# Patient Record
Sex: Female | Born: 1995 | Hispanic: Yes | Marital: Single | Smoking: Never smoker
Health system: Southern US, Community
[De-identification: ages and names within clinical notes are randomized; demographics above are authoritative.]

## PROBLEM LIST (undated history)

## (undated) ENCOUNTER — Inpatient Hospital Stay (HOSPITAL_COMMUNITY): Payer: Self-pay

## (undated) DIAGNOSIS — Z789 Other specified health status: Secondary | ICD-10-CM

## (undated) HISTORY — DX: Other specified health status: Z78.9

## (undated) HISTORY — PX: DILATION AND CURETTAGE OF UTERUS: SHX78

---
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
## 2016-01-20 ENCOUNTER — Encounter: Payer: Self-pay | Admitting: Obstetrics and Gynecology

## 2016-01-20 ENCOUNTER — Ambulatory Visit (INDEPENDENT_AMBULATORY_CARE_PROVIDER_SITE_OTHER): Payer: Medicaid Other | Admitting: Obstetrics and Gynecology

## 2016-01-20 VITALS — BP 113/82 | HR 88 | Ht 68.0 in | Wt 142.3 lb

## 2016-01-20 DIAGNOSIS — Z30430 Encounter for insertion of intrauterine contraceptive device: Secondary | ICD-10-CM

## 2016-01-20 DIAGNOSIS — Z3202 Encounter for pregnancy test, result negative: Secondary | ICD-10-CM

## 2016-01-20 LAB — POCT PREGNANCY, URINE: PREG TEST UR: NEGATIVE

## 2016-01-20 MED ORDER — LEVONORGESTREL 18.6 MCG/DAY IU IUD
INTRAUTERINE_SYSTEM | Freq: Once | INTRAUTERINE | Status: AC
  Administered 2016-01-20: 11:00:00 via INTRAUTERINE

## 2016-01-20 NOTE — Patient Instructions (Signed)
Levonorgestrel intrauterine device (IUD) What is this medicine? LEVONORGESTREL IUD (LEE voe nor jes trel) is a contraceptive (birth control) device. The device is placed inside the uterus by a healthcare professional. It is used to prevent pregnancy and can also be used to treat heavy bleeding that occurs during your period. Depending on the device, it can be used for 3 to 5 years. This medicine may be used for other purposes; ask your health care provider or pharmacist if you have questions. What should I tell my health care provider before I take this medicine? They need to know if you have any of these conditions: -abnormal Pap smear -cancer of the breast, uterus, or cervix -diabetes -endometritis -genital or pelvic infection now or in the past -have more than one sexual partner or your partner has more than one partner -heart disease -history of an ectopic or tubal pregnancy -immune system problems -IUD in place -liver disease or tumor -problems with blood clots or take blood-thinners -use intravenous drugs -uterus of unusual shape -vaginal bleeding that has not been explained -an unusual or allergic reaction to levonorgestrel, other hormones, silicone, or polyethylene, medicines, foods, dyes, or preservatives -pregnant or trying to get pregnant -breast-feeding How should I use this medicine? This device is placed inside the uterus by a health care professional. Talk to your pediatrician regarding the use of this medicine in children. Special care may be needed. Overdosage: If you think you have taken too much of this medicine contact a poison control center or emergency room at once. NOTE: This medicine is only for you. Do not share this medicine with others. What if I miss a dose? This does not apply. What may interact with this medicine? Do not take this medicine with any of the following medications: -amprenavir -bosentan -fosamprenavir This medicine may also interact with  the following medications: -aprepitant -barbiturate medicines for inducing sleep or treating seizures -bexarotene -griseofulvin -medicines to treat seizures like carbamazepine, ethotoin, felbamate, oxcarbazepine, phenytoin, topiramate -modafinil -pioglitazone -rifabutin -rifampin -rifapentine -some medicines to treat HIV infection like atazanavir, indinavir, lopinavir, nelfinavir, tipranavir, ritonavir -St. John's wort -warfarin This list may not describe all possible interactions. Give your health care provider a list of all the medicines, herbs, non-prescription drugs, or dietary supplements you use. Also tell them if you smoke, drink alcohol, or use illegal drugs. Some items may interact with your medicine. What should I watch for while using this medicine? Visit your doctor or health care professional for regular check ups. See your doctor if you or your partner has sexual contact with others, becomes HIV positive, or gets a sexual transmitted disease. This product does not protect you against HIV infection (AIDS) or other sexually transmitted diseases. You can check the placement of the IUD yourself by reaching up to the top of your vagina with clean fingers to feel the threads. Do not pull on the threads. It is a good habit to check placement after each menstrual period. Call your doctor right away if you feel more of the IUD than just the threads or if you cannot feel the threads at all. The IUD may come out by itself. You may become pregnant if the device comes out. If you notice that the IUD has come out use a backup birth control method like condoms and call your health care provider. Using tampons will not change the position of the IUD and are okay to use during your period. What side effects may I notice from receiving this medicine?   Side effects that you should report to your doctor or health care professional as soon as possible: -allergic reactions like skin rash, itching or  hives, swelling of the face, lips, or tongue -fever, flu-like symptoms -genital sores -high blood pressure -no menstrual period for 6 weeks during use -pain, swelling, warmth in the leg -pelvic pain or tenderness -severe or sudden headache -signs of pregnancy -stomach cramping -sudden shortness of breath -trouble with balance, talking, or walking -unusual vaginal bleeding, discharge -yellowing of the eyes or skin Side effects that usually do not require medical attention (report to your doctor or health care professional if they continue or are bothersome): -acne -breast pain -change in sex drive or performance -changes in weight -cramping, dizziness, or faintness while the device is being inserted -headache -irregular menstrual bleeding within first 3 to 6 months of use -nausea This list may not describe all possible side effects. Call your doctor for medical advice about side effects. You may report side effects to FDA at 1-800-FDA-1088. Where should I keep my medicine? This does not apply. NOTE: This sheet is a summary. It may not cover all possible information. If you have questions about this medicine, talk to your doctor, pharmacist, or health care provider.    2016, Elsevier/Gold Standard. (2011-04-23 13:54:04)  

## 2016-01-20 NOTE — Progress Notes (Signed)
Subjective:     Beverly Walter is a 20 y.o. female [phone] who presents for a postpartum visit. She is 6 weeks postpartum following a spontaneous vaginal delivery. I have fully reviewed the prenatal and intrapartum course. The delivery was at 34 gestational weeks. PROM, SOOL. No perineal lacerations. Outcome: vaginal birth after cesarean (VBAC). Anesthesia: epidural. Postpartum course has been uncomplicated. Baby's course has been uncomplicated. Baby is feeding by both breast and bottle - Similac Neosure. Bleeding no bleeding after 2 wks until onset menses 3 days ago.Bowel function is normal. Bladder function is normal. Patient is not sexually active. Contraception method is LARC planned. Considering IUD or Nexplanon.  Postpartum depression screening: negative.  The following portions of the patient's history were reviewed and updated as appropriate: allergies, current medications, past family history, past medical history, past social history, past surgical history and problem list.  Review of Systems Pertinent items are noted in HPI.   Objective:    BP 113/82   Pulse 88   General:  alert, cooperative and no distress   Breasts:  inspection negative, no nipple discharge or bleeding, no masses or nodularity palpable  Lungs: clear to auscultation bilaterally  Heart:  regular rate and rhythm, S1, S2 normal, no murmur, click, rub or gallop  Abdomen: soft, non-tender; bowel sounds normal; no masses,  no organomegaly   Vulva:  not evaluated  Vagina: not evaluated  Cervix:  not evaluated  Corpus: not examined  Adnexa:  not evaluated  Rectal Exam: Not performed.        Assessment:     Teen P1102 at 6 wks postpartum exam. Pap smear not done at today's visit.   Plan:    1. Contraception: counseled at length on options> desires Nexplanon 2. Encouraged continuation of breastfeeding 3. Follow up in: 6 months or as needed.    Procedure Note: Liletta Insertion  Indicaiton: desires LARC. No  medical contraindications to hormonal contraception. Time out performed and informed consent done. Counseled on risks including infection, bleeding, abnormal bleeding patter, migration of device.   With patient in lithotomy, sterile speculum inserted and cx visualized mid position, moderate bleeding. . Cleansed with Betadine. Sounded to 6 cm. Liletta inserted per manufacturer's recommendation. Stings cut to 3 cm. Patient tolerated the procedure well  Return for string check in 4-5 weeks

## 2016-02-25 ENCOUNTER — Ambulatory Visit: Payer: Self-pay | Admitting: Family Medicine

## 2017-04-28 ENCOUNTER — Ambulatory Visit: Payer: Self-pay

## 2018-06-28 ENCOUNTER — Ambulatory Visit: Payer: Self-pay | Admitting: Student

## 2018-06-30 ENCOUNTER — Telehealth: Payer: Self-pay

## 2018-06-30 NOTE — Telephone Encounter (Signed)
Pt called in because she missed her appointment and was wondering if she needed to be seen for toxic shock syndrome, pt stated that the tampon has since been removed and she has not had a period since, advised pt to take a home UPT to be sure and, Advised pt that if she didn't have any fever or chills than she doesn't need to be seen in the office, if she became symptomatic then she should go to the Emergency room to be seen. Pt verbalized understanding

## 2019-12-06 ENCOUNTER — Ambulatory Visit (INDEPENDENT_AMBULATORY_CARE_PROVIDER_SITE_OTHER): Payer: Medicaid Other | Admitting: Family Medicine

## 2019-12-06 ENCOUNTER — Other Ambulatory Visit (HOSPITAL_COMMUNITY)
Admission: RE | Admit: 2019-12-06 | Discharge: 2019-12-06 | Disposition: A | Discharge status: Home / Self Care | Payer: Medicaid Other | Source: Ambulatory Visit | Attending: Family Medicine | Admitting: Family Medicine

## 2019-12-06 ENCOUNTER — Encounter: Payer: Self-pay | Admitting: Family Medicine

## 2019-12-06 ENCOUNTER — Other Ambulatory Visit: Payer: Self-pay

## 2019-12-06 VITALS — BP 126/84 | HR 85 | Wt 118.0 lb

## 2019-12-06 DIAGNOSIS — Z30433 Encounter for removal and reinsertion of intrauterine contraceptive device: Secondary | ICD-10-CM | POA: Insufficient documentation

## 2019-12-06 DIAGNOSIS — Z975 Presence of (intrauterine) contraceptive device: Secondary | ICD-10-CM

## 2019-12-06 MED ORDER — LEVONORGESTREL 19.5 MCG/DAY IU IUD: INTRAUTERINE_SYSTEM | Freq: Once | INTRAUTERINE | Status: AC

## 2019-12-06 NOTE — Progress Notes (Signed)
    GYNECOLOGY OFFICE PROCEDURE NOTE  Beverly Walter is a 24 y.o. [id number] here for [place] IUD removal. No GYN concerns.  Never had pap smear, will complete today.  IUD Removal/reinsertion Patient identified, informed consent performed, consent signed.  Patient was in the dorsal lithotomy position, normal external genitalia was noted.  A speculum was placed in the patient's vagina, normal discharge was noted, no lesions. The cervix was visualized, no lesions, no abnormal discharge.  The strings of the IUD were grasped and pulled using ring forceps. The IUD was removed in its entirety.   Grasped anteriourly/posteriorly with a single tooth tenaculum.  Uterus sounded to 6cm.  Liletta IUD placed per manufacturer's recommendations.  Strings trimmed to 3 cm. Pt tolerated procedure well.  Patient given post procedure instructions and IUD care card with expiration dat  Arrie Senate, MD OB Fellow, [facility] 12/06/2019 1:14 PM

## 2019-12-06 NOTE — Patient Instructions (Signed)
Levonorgestrel intrauterine device (IUD) What is this medicine? LEVONORGESTREL IUD (LEE voe nor jes trel) is a contraceptive (birth control) device. The device is placed inside the uterus by a healthcare professional. It is used to prevent pregnancy. This device can also be used to treat heavy bleeding that occurs during your period. This medicine may be used for other purposes; ask your health care provider or pharmacist if you have questions. COMMON BRAND NAME(S): Kyleena, LILETTA, Mirena, Skyla What should I tell my health care provider before I take this medicine? They need to know if you have any of these conditions:  abnormal Pap smear  cancer of the breast, uterus, or cervix  diabetes  endometritis  genital or pelvic infection now or in the past  have more than one sexual partner or your partner has more than one partner  heart disease  history of an ectopic or tubal pregnancy  immune system problems  IUD in place  liver disease or tumor  problems with blood clots or take blood-thinners  seizures  use intravenous drugs  uterus of unusual shape  vaginal bleeding that has not been explained  an unusual or allergic reaction to levonorgestrel, other hormones, silicone, or polyethylene, medicines, foods, dyes, or preservatives  pregnant or trying to get pregnant  breast-feeding How should I use this medicine? This device is placed inside the uterus by a health care professional. Talk to your pediatrician regarding the use of this medicine in children. Special care may be needed. Overdosage: If you think you have taken too much of this medicine contact a poison control center or emergency room at once. NOTE: This medicine is only for you. Do not share this medicine with others. What if I miss a dose? This does not apply. Depending on the brand of device you have inserted, the device will need to be replaced every 3 to 6 years if you wish to continue using this type  of birth control. What may interact with this medicine? Do not take this medicine with any of the following medications:  amprenavir  bosentan  fosamprenavir This medicine may also interact with the following medications:  aprepitant  armodafinil  barbiturate medicines for inducing sleep or treating seizures  bexarotene  boceprevir  griseofulvin  medicines to treat seizures like carbamazepine, ethotoin, felbamate, oxcarbazepine, phenytoin, topiramate  modafinil  pioglitazone  rifabutin  rifampin  rifapentine  some medicines to treat HIV infection like atazanavir, efavirenz, indinavir, lopinavir, nelfinavir, tipranavir, ritonavir  St. John's wort  warfarin This list may not describe all possible interactions. Give your health care provider a list of all the medicines, herbs, non-prescription drugs, or dietary supplements you use. Also tell them if you smoke, drink alcohol, or use illegal drugs. Some items may interact with your medicine. What should I watch for while using this medicine? Visit your doctor or health care professional for regular check ups. See your doctor if you or your partner has sexual contact with others, becomes HIV positive, or gets a sexual transmitted disease. This product does not protect you against HIV infection (AIDS) or other sexually transmitted diseases. You can check the placement of the IUD yourself by reaching up to the top of your vagina with clean fingers to feel the threads. Do not pull on the threads. It is a good habit to check placement after each menstrual period. Call your doctor right away if you feel more of the IUD than just the threads or if you cannot feel the threads at   all. The IUD may come out by itself. You may become pregnant if the device comes out. If you notice that the IUD has come out use a backup birth control method like condoms and call your health care provider. Using tampons will not change the position of the  IUD and are okay to use during your period. This IUD can be safely scanned with magnetic resonance imaging (MRI) only under specific conditions. Before you have an MRI, tell your healthcare provider that you have an IUD in place, and which type of IUD you have in place. What side effects may I notice from receiving this medicine? Side effects that you should report to your doctor or health care professional as soon as possible:  allergic reactions like skin rash, itching or hives, swelling of the face, lips, or tongue  fever, flu-like symptoms  genital sores  high blood pressure  no menstrual period for 6 weeks during use  pain, swelling, warmth in the leg  pelvic pain or tenderness  severe or sudden headache  signs of pregnancy  stomach cramping  sudden shortness of breath  trouble with balance, talking, or walking  unusual vaginal bleeding, discharge  yellowing of the eyes or skin Side effects that usually do not require medical attention (report to your doctor or health care professional if they continue or are bothersome):  acne  breast pain  change in sex drive or performance  changes in weight  cramping, dizziness, or faintness while the device is being inserted  headache  irregular menstrual bleeding within first 3 to 6 months of use  nausea This list may not describe all possible side effects. Call your doctor for medical advice about side effects. You may report side effects to FDA at 1-800-FDA-1088. Where should I keep my medicine? This does not apply. NOTE: This sheet is a summary. It may not cover all possible information. If you have questions about this medicine, talk to your doctor, pharmacist, or health care provider.  2020 Elsevier/Gold Standard (2018-02-01 13:22:01)  

## 2019-12-07 LAB — CYTOLOGY - PAP
Chlamydia: NEGATIVE
Comment: NEGATIVE
Comment: NEGATIVE
Comment: NORMAL
Diagnosis: NEGATIVE
High risk HPV: NEGATIVE
Neisseria Gonorrhea: NEGATIVE

## 2019-12-10 ENCOUNTER — Emergency Department (HOSPITAL_COMMUNITY): Payer: Self-pay

## 2019-12-10 ENCOUNTER — Other Ambulatory Visit: Payer: Self-pay

## 2019-12-10 ENCOUNTER — Emergency Department (HOSPITAL_COMMUNITY)
Admission: EM | Admit: 2019-12-10 | Discharge: 2019-12-10 | Disposition: A | Discharge status: Home / Self Care | Payer: Self-pay | Attending: Emergency Medicine | Admitting: Emergency Medicine

## 2019-12-10 ENCOUNTER — Encounter (HOSPITAL_COMMUNITY): Payer: Self-pay

## 2019-12-10 DIAGNOSIS — Z79899 Other long term (current) drug therapy: Secondary | ICD-10-CM | POA: Insufficient documentation

## 2019-12-10 DIAGNOSIS — J029 Acute pharyngitis, unspecified: Secondary | ICD-10-CM | POA: Insufficient documentation

## 2019-12-10 DIAGNOSIS — E041 Nontoxic single thyroid nodule: Secondary | ICD-10-CM | POA: Insufficient documentation

## 2019-12-10 LAB — CBC WITH DIFFERENTIAL/PLATELET
Abs Immature Granulocytes: 0.01 10*3/uL (ref 0.00–0.07)
Basophils Absolute: 0 10*3/uL (ref 0.0–0.1)
Basophils Relative: 0 %
Eosinophils Absolute: 0 10*3/uL (ref 0.0–0.5)
Eosinophils Relative: 1 %
HCT: 44.9 % (ref 36.0–46.0)
Hemoglobin: 14.8 g/dL (ref 12.0–15.0)
Immature Granulocytes: 0 %
Lymphocytes Relative: 26 %
Lymphs Abs: 1.7 10*3/uL (ref 0.7–4.0)
MCH: 29.8 pg (ref 26.0–34.0)
MCHC: 33 g/dL (ref 30.0–36.0)
MCV: 90.5 fL (ref 80.0–100.0)
Monocytes Absolute: 0.6 10*3/uL (ref 0.1–1.0)
Monocytes Relative: 10 %
Neutro Abs: 4 10*3/uL (ref 1.7–7.7)
Neutrophils Relative %: 63 %
Platelets: 238 10*3/uL (ref 150–400)
RBC: 4.96 MIL/uL (ref 3.87–5.11)
RDW: 12 % (ref 11.5–15.5)
WBC: 6.4 10*3/uL (ref 4.0–10.5)
nRBC: 0 % (ref 0.0–0.2)

## 2019-12-10 LAB — BASIC METABOLIC PANEL
Anion gap: 8 (ref 5–15)
BUN: 11 mg/dL (ref 6–20)
CO2: 26 mmol/L (ref 22–32)
Calcium: 9.6 mg/dL (ref 8.9–10.3)
Chloride: 103 mmol/L (ref 98–111)
Creatinine, Ser: 0.61 mg/dL (ref 0.44–1.00)
GFR calc Af Amer: >60 mL/min (ref >60)
GFR calc non Af Amer: >60 mL/min (ref >60)
Glucose, Bld: 99 mg/dL (ref 70–99)
Potassium: 3.2 mmol/L — ABNORMAL LOW (ref 3.5–5.1)
Sodium: 137 mmol/L (ref 135–145)

## 2019-12-10 LAB — GROUP A STREP BY PCR: Group A Strep by PCR: NOT DETECTED

## 2019-12-10 LAB — MONONUCLEOSIS SCREEN: Mono Screen: NEGATIVE

## 2019-12-10 MED ORDER — POTASSIUM CHLORIDE CRYS ER 20 MEQ PO TBCR
40.0000 meq | EXTENDED_RELEASE_TABLET | Freq: Once | ORAL | Status: AC
  Administered 2019-12-10: 40 meq via ORAL
  Filled 2019-12-10: qty 2

## 2019-12-10 MED ORDER — IOHEXOL 300 MG/ML  SOLN
75.0000 mL | Freq: Once | INTRAMUSCULAR | Status: AC | PRN
  Administered 2019-12-10: 75 mL via INTRAVENOUS

## 2019-12-10 NOTE — Discharge Instructions (Addendum)
Your work-up today was reassuring, your CT scan does show a thyroid nodule, you can use the attached instructions to read about this.  I need you to follow-up with your primary care doctor to get an ultrasound of this as we spoke about.  If you do not have one you can use the list below.  Or you can go to [facility] which I referred you to.  If you have any shortness of breath, difficulty breathing, difficulty swallowing, drooling please come back to the emergency department.  Your potassium was slightly low, we did give you some potassium here today.  I want you to eat bananas when you get home for the next couple of days.  Please come back to the emergency department for any new or worsening concerning symptoms.

## 2019-12-10 NOTE — ED Provider Notes (Signed)
[facility] EMERGENCY DEPARTMENT Provider Note   CSN: [id number] Arrival date & time: 12/10/19  1058     History No chief complaint on file.   Beverly Walter is a 24 y.o. female with IDA that presents the emergency department today for sore throat and neck mass.  Patient states that she has had this for 5 weeks, states that a couple weeks ago she started having difficulty breathing with mass increasing in size.  States that it was worse when she lay down.  States that she had a fever with bad sore throat when this occurred.  Was tested for Covid at the time which was negative.  States that symptoms started to disappear but however the past couple of days mass has gotten bigger.   No current difficulty breathing, however feels like that it could go this way again.  Was supposed to get a scan of her throat with her primary care, however her insurance would not cover it therefore she came to the emergency room.  Currently states that her sore throat is mild, describes it as scratchy. No truly sore.  States that lump in her throat is starting to expand again.  Denies any current fevers, chills, cough, congestion, other lumps, night sweats, nausea, vomiting, abdominal pain, back pain.  States that she has been eating and drinking normally.  Has been able to handle secretions.  No dysphagia or odynophagia.  Denies any history of thyroid cancer or lymphoma, no history of thyroid disease.  States that she lost about 3 pounds in the past couple of weeks unintentionally.  Nothing makes it worse or better.  Has not tried anything for it.  HPI     Past Medical History:  Diagnosis Date  . Medical history non-contributory     Patient Active Problem List   Diagnosis Date Noted  . IUD (intrauterine device) in place 12/06/2019  . Iron deficiency anemia 11/08/2015    Past Surgical History:  Procedure Laterality Date  . CESAREAN SECTION    . DILATION AND CURETTAGE OF UTERUS       OB  History    Gravida  3   Para  2   Term  1   Preterm  1   AB  1   Living  2     SAB  1   TAB  0   Ectopic  0   Multiple  0   Live Births  1           No family history on file.  Social History   Tobacco Use  . Smoking status: Never Smoker  . Smokeless tobacco: Never Used  Substance Use Topics  . Alcohol use: No  . Drug use: No    Home Medications Prior to Admission medications   Medication Sig Start Date End Date Taking? Authorizing Provider  citalopram (CELEXA) 20 MG tablet Take 1 tablet (20 mg total) by mouth daily. Patient not taking: Reported on 01/20/2016 11/21/15   Truett Mainland, DO  ferrous sulfate 325 (65 FE) MG tablet Take 1 tablet (325 mg total) by mouth daily. Patient not taking: Reported on 01/20/2016 11/08/15   Luvenia Redden, PA-C  ibuprofen (ADVIL,MOTRIN) 600 MG tablet Take 1 tablet (600 mg total) by mouth every 6 (six) hours. 12/09/15   Keitha Butte, CNM  Prenatal Multivit-Min-Fe-FA (PRENATAL VITAMINS) 0.8 MG tablet Take 1 tablet by mouth daily. Patient not taking: Reported on 01/20/2016 10/14/15   Woodroe Mode, MD  Allergies    Patient has no known allergies.  Review of Systems   Review of Systems  Constitutional: Negative for chills, diaphoresis, fatigue and fever.  HENT: Positive for sore throat. Negative for congestion, drooling, facial swelling, nosebleeds, postnasal drip, rhinorrhea, sinus pressure, sinus pain, sneezing and trouble swallowing.   Eyes: Negative for pain and visual disturbance.  Respiratory: Negative for cough, shortness of breath and wheezing.   Cardiovascular: Negative for chest pain, palpitations and leg swelling.  Gastrointestinal: Negative for abdominal distention, abdominal pain, diarrhea, nausea and vomiting.  Genitourinary: Negative for difficulty urinating.  Musculoskeletal: Negative for back pain, neck pain and neck stiffness.  Skin: Negative for pallor.  Neurological: Negative for dizziness, speech  difficulty, weakness and headaches.  Psychiatric/Behavioral: Negative for confusion.    Physical Exam Updated Vital Signs BP 117/83 (BP Location: Left Arm)   Pulse 82   Temp 97.6 F (36.4 C) (Oral)   Resp 14   SpO2 99%   Physical Exam Constitutional:      General: She is not in acute distress.    Appearance: Normal appearance. She is not ill-appearing, toxic-appearing or diaphoretic.     Comments: Patient without acute respiratory stress.  Patient is sitting comfortably in bed, no tripoding, use of accessory muscles.  Patient is speaking to me in full sentences.  Handling secretions well.  HENT:     Head: Normocephalic and atraumatic.     Jaw: There is normal jaw occlusion. No trismus, swelling or malocclusion.     Nose: No congestion or rhinorrhea.     Right Sinus: No maxillary sinus tenderness or frontal sinus tenderness.     Left Sinus: No maxillary sinus tenderness or frontal sinus tenderness.     Mouth/Throat:     Mouth: Mucous membranes are moist. No oral lesions.     Dentition: Normal dentition.     Tongue: No lesions.     Palate: No mass and lesions.     Pharynx: Oropharynx is clear. Uvula midline. No pharyngeal swelling, oropharyngeal exudate, posterior oropharyngeal erythema or uvula swelling.     Tonsils: No tonsillar exudate or tonsillar abscesses. 1+ on the right. 1+ on the left.     Comments: Patient without tonsillar enlargement or exudate.  No signs of peritonsillar abscess, palate without any tenderness or masses palpated.  No swelling under the tongue, uvula is midline without any inflammation. Eyes:     General: No visual field deficit or scleral icterus.       Right eye: No discharge.        Left eye: No discharge.     Extraocular Movements: Extraocular movements intact.     Conjunctiva/sclera: Conjunctivae normal.     Pupils: Pupils are equal, round, and reactive to light.  Neck:     Thyroid: No thyromegaly or thyroid tenderness.      Comments: Neck mass  as depicted above, is a little bigger than a marble, mobile and nontender.  On trachea, slightly below the thyroid glands.  No erythema or induration around it.  No warmth.  Patient is able to swallow without any difficulties. Cardiovascular:     Rate and Rhythm: Normal rate and regular rhythm.     Pulses: Normal pulses.     Heart sounds: Normal heart sounds. No murmur heard.  No friction rub. No gallop.   Pulmonary:     Effort: Pulmonary effort is normal. No respiratory distress.     Breath sounds: Normal breath sounds. No stridor. No wheezing, rhonchi  or rales.  Chest:     Chest wall: No tenderness.  Abdominal:     General: Abdomen is flat. Bowel sounds are normal. There is no distension.     Palpations: Abdomen is soft.     Tenderness: There is no abdominal tenderness. There is no right CVA tenderness, left CVA tenderness, guarding or rebound.  Musculoskeletal:        General: No swelling or tenderness. Normal range of motion.     Cervical back: Full passive range of motion without pain, normal range of motion and neck supple. No rigidity or tenderness. No pain with movement. Normal range of motion.     Right lower leg: No edema.     Left lower leg: No edema.  Lymphadenopathy:     Head:     Right side of head: No submental, submandibular, tonsillar, preauricular, posterior auricular or occipital adenopathy.     Left side of head: No submental, submandibular, tonsillar, preauricular, posterior auricular or occipital adenopathy.     Cervical: No cervical adenopathy.     Right cervical: No superficial, deep or posterior cervical adenopathy.    Left cervical: No superficial, deep or posterior cervical adenopathy.     Upper Body:     Right upper body: No supraclavicular or axillary adenopathy.     Left upper body: No supraclavicular or axillary adenopathy.  Skin:    General: Skin is warm and dry.     Capillary Refill: Capillary refill takes less than 2 seconds.     Coloration: Skin is  not pale.     Findings: No erythema or rash.  Neurological:     General: No focal deficit present.     Mental Status: She is alert and oriented to person, place, and time.     Cranial Nerves: Cranial nerves are intact. No cranial nerve deficit or facial asymmetry.     Motor: Motor function is intact. No weakness.     Coordination: Coordination is intact.     Gait: Gait is intact. Gait normal.  Psychiatric:        Mood and Affect: Mood normal.        Behavior: Behavior normal.     ED Results / Procedures / Treatments   Labs (all labs ordered are listed, but only abnormal results are displayed) Labs Reviewed  BASIC METABOLIC PANEL - Abnormal; Notable for the following components:      Result Value   Potassium 3.2 (*)    All other components within normal limits  GROUP A STREP BY PCR  CBC WITH DIFFERENTIAL/PLATELET  MONONUCLEOSIS SCREEN    EKG None  Radiology CT Soft Tissue Neck W Contrast  Result Date: 12/10/2019 CLINICAL DATA:  Neck mass.  Throat tightness. EXAM: CT NECK WITH CONTRAST TECHNIQUE: Multidetector CT imaging of the neck was performed using the standard protocol following the bolus administration of intravenous contrast. CONTRAST:  30mL OMNIPAQUE IOHEXOL 300 MG/ML  SOLN COMPARISON:  None. FINDINGS: Pharynx and larynx: No evidence of mass or swelling. No fluid collection or inflammatory changes in the parapharyngeal or retropharyngeal spaces. Salivary glands: No inflammation, mass, or stone. Thyroid: 1.7 cm well-circumscribed hypoattenuating nodule in the thyroid isthmus. Lymph nodes: No enlarged or suspicious lymph nodes in the neck. Vascular: Major vascular structures of the neck are patent. Limited intracranial: Unremarkable. Visualized orbits: Unremarkable. Mastoids and visualized paranasal sinuses: Opacification of a single left ethmoid air cell. Mild left maxillary sinus mucosal thickening. Clear mastoid air cells. Skeleton: No acute osseous abnormality  or suspicious  osseous lesion. Upper chest: Clear lung apices. Other: None. IMPRESSION: 1. 1.7 cm thyroid nodule. Recommend nonemergent thyroid US (ref: J Am Coll Radiol. 2015 Feb;12(2): 143-50). 2. Otherwise negative neck CT. Electronically Signed   By: Logan Bores M.D.   On: 12/10/2019 19:04    Procedures Procedures (including critical care time)  Medications Ordered in ED Medications  iohexol (OMNIPAQUE) 300 MG/ML solution 75 mL (75 mLs Intravenous Contrast Given 12/10/19 1833)  potassium chloride SA (KLOR-CON) CR tablet 40 mEq (40 mEq Oral Given 12/10/19 1916)    ED Course  I have reviewed the triage vital signs and the nursing notes.  Pertinent labs & imaging results that were available during my care of the patient were reviewed by me and considered in my medical decision making (see chart for details).    MDM Rules/Calculators/A&P                          Beverly Walter is a 24 y.o. female with IDA that presents the emergency department today for sore throat and neck mass. No true sore throat currently. Patient states that she has had this for 5 weeks, states that a couple weeks ago she started having difficulty breathing with mass increasing in size.  Physical exam with marble sized mass on trachea, slightly lower than thyroid gland, nontender and mobile.  Does appear lymph node-like.  No other lymph nodes noted.  No overlying signs of infection.  I think that CT is warranted at this time due to patient stating that it is expanding an she did have difficulty breathing when this happened last time.  Patient currently is not in any acute distress, is able to handle secretions and sitting comfortably in bed.  No signs of peritonsillar abscess, deep space flexion, Ludwig's.  Work-up negative.  BMP with potassium of 3.2, did replete orally today and Dr. Patient about eating high potassium rich foods.  CBC unremarkable.  Mono and strep negative.  CT does show thyroid nodule, recommend nonemergent ultrasound.   Did discuss this with patient.  Patient still not complaining of respiratory distress, sitting comfortably in bed.  Patient agreeable for follow-up with PCP.  Patient be discharged.  Doubt need for further emergent work up at this time. I explained the diagnosis and have given explicit precautions to return to the ER including for any other new or worsening symptoms. The patient understands and accepts the medical plan as it's been dictated and I have answered their questions. Discharge instructions concerning home care and prescriptions have been given. The patient is STABLE and is discharged to home in good condition.  I discussed this case with my attending physician who cosigned this note including patient's presenting symptoms, physical exam, and planned diagnostics and interventions. Attending physician stated agreement with plan or made changes to plan which were implemented.   Final Clinical Impression(s) / ED Diagnoses Final diagnoses:  Thyroid nodule    Rx / DC Orders ED Discharge Orders    None       Alfredia Client, PA-C 12/10/19 1925    Lennice Sites, DO 12/10/19 1942

## 2019-12-10 NOTE — ED Triage Notes (Signed)
Patient complains of pain to possible goiter x several weeks. No distess, no trouble swallowing/

## 2019-12-14 ENCOUNTER — Encounter: Payer: Self-pay | Admitting: General Practice

## 2020-01-17 ENCOUNTER — Ambulatory Visit: Payer: Medicaid Other | Admitting: Family Medicine

## 2020-02-12 ENCOUNTER — Emergency Department (HOSPITAL_COMMUNITY)
Admission: EM | Admit: 2020-02-12 | Discharge: 2020-02-12 | Disposition: A | Discharge status: Home / Self Care | Payer: Medicaid Other | Attending: Emergency Medicine | Admitting: Emergency Medicine

## 2020-02-12 ENCOUNTER — Emergency Department (HOSPITAL_COMMUNITY): Payer: Medicaid Other

## 2020-02-12 ENCOUNTER — Encounter (HOSPITAL_COMMUNITY): Payer: Self-pay | Admitting: Emergency Medicine

## 2020-02-12 ENCOUNTER — Other Ambulatory Visit: Payer: Self-pay

## 2020-02-12 DIAGNOSIS — Z5321 Procedure and treatment not carried out due to patient leaving prior to being seen by health care provider: Secondary | ICD-10-CM | POA: Insufficient documentation

## 2020-02-12 DIAGNOSIS — R079 Chest pain, unspecified: Secondary | ICD-10-CM | POA: Insufficient documentation

## 2020-02-12 DIAGNOSIS — R0602 Shortness of breath: Secondary | ICD-10-CM | POA: Insufficient documentation

## 2020-02-12 LAB — CBC
HCT: 43.7 % (ref 36.0–46.0)
Hemoglobin: 14.5 g/dL (ref 12.0–15.0)
MCH: 30.8 pg (ref 26.0–34.0)
MCHC: 33.2 g/dL (ref 30.0–36.0)
MCV: 92.8 fL (ref 80.0–100.0)
Platelets: 245 10*3/uL (ref 150–400)
RBC: 4.71 MIL/uL (ref 3.87–5.11)
RDW: 12 % (ref 11.5–15.5)
WBC: 5.5 10*3/uL (ref 4.0–10.5)
nRBC: 0 % (ref 0.0–0.2)

## 2020-02-12 LAB — BASIC METABOLIC PANEL
Anion gap: 10 (ref 5–15)
BUN: 7 mg/dL (ref 6–20)
CO2: 22 mmol/L (ref 22–32)
Calcium: 9.4 mg/dL (ref 8.9–10.3)
Chloride: 107 mmol/L (ref 98–111)
Creatinine, Ser: 0.57 mg/dL (ref 0.44–1.00)
GFR, Estimated: >60 mL/min (ref >60)
Glucose, Bld: 100 mg/dL — ABNORMAL HIGH (ref 70–99)
Potassium: 3.7 mmol/L (ref 3.5–5.1)
Sodium: 139 mmol/L (ref 135–145)

## 2020-02-12 LAB — I-STAT BETA HCG BLOOD, ED (MC, WL, AP ONLY): I-stat hCG, quantitative: <5 m[IU]/mL (ref <5)

## 2020-02-12 LAB — TROPONIN I (HIGH SENSITIVITY)
Troponin I (High Sensitivity): <2 ng/L (ref <18)
Troponin I (High Sensitivity): <2 ng/L (ref <18)

## 2020-02-12 MED ORDER — ALBUTEROL SULFATE (2.5 MG/3ML) 0.083% IN NEBU: 5.0000 mg | INHALATION_SOLUTION | Freq: Once | RESPIRATORY_TRACT | Status: DC

## 2020-02-12 NOTE — ED Notes (Signed)
Pt left due to not being seen quick enough 

## 2020-02-12 NOTE — ED Triage Notes (Signed)
Mid cp since last Thursday with no relief and SOB.

## 2020-02-12 NOTE — ED Triage Notes (Addendum)
Chart on wrong pt

## 2020-02-13 ENCOUNTER — Other Ambulatory Visit: Payer: Self-pay

## 2020-02-13 ENCOUNTER — Emergency Department (HOSPITAL_COMMUNITY)
Admission: EM | Admit: 2020-02-13 | Discharge: 2020-02-14 | Disposition: A | Discharge status: Home / Self Care | Payer: Medicaid Other | Attending: Emergency Medicine | Admitting: Emergency Medicine

## 2020-02-13 ENCOUNTER — Telehealth (HOSPITAL_COMMUNITY): Payer: Self-pay

## 2020-02-13 ENCOUNTER — Encounter (HOSPITAL_COMMUNITY): Payer: Self-pay

## 2020-02-13 DIAGNOSIS — R002 Palpitations: Secondary | ICD-10-CM | POA: Insufficient documentation

## 2020-02-13 DIAGNOSIS — E041 Nontoxic single thyroid nodule: Secondary | ICD-10-CM | POA: Insufficient documentation

## 2020-02-13 DIAGNOSIS — R079 Chest pain, unspecified: Secondary | ICD-10-CM | POA: Insufficient documentation

## 2020-02-13 NOTE — ED Triage Notes (Signed)
Pt was seen 12-10-19 in ED for neck swelling, dx thyroid nodule.  Was advised to get non-emergent thyroid u/s.  Was referred to her OBGYN who could not do u/s.   Came to ED yesterday but LWBS  Pt feels thyroid nodule has gotten slightly larger.

## 2020-02-14 ENCOUNTER — Encounter: Payer: Self-pay | Admitting: *Deleted

## 2020-02-14 ENCOUNTER — Emergency Department (HOSPITAL_COMMUNITY): Payer: Medicaid Other

## 2020-02-14 LAB — BASIC METABOLIC PANEL WITH GFR
Anion gap: 11 (ref 5–15)
BUN: 10 mg/dL (ref 6–20)
CO2: 23 mmol/L (ref 22–32)
Calcium: 10 mg/dL (ref 8.9–10.3)
Chloride: 105 mmol/L (ref 98–111)
Creatinine, Ser: 0.54 mg/dL (ref 0.44–1.00)
GFR, Estimated: >60 mL/min
Glucose, Bld: 81 mg/dL (ref 70–99)
Potassium: 3.8 mmol/L (ref 3.5–5.1)
Sodium: 139 mmol/L (ref 135–145)

## 2020-02-14 LAB — TROPONIN I (HIGH SENSITIVITY): Troponin I (High Sensitivity): 3 ng/L (ref <18)

## 2020-02-14 LAB — CBC WITH DIFFERENTIAL/PLATELET
Abs Immature Granulocytes: 0.01 10*3/uL (ref 0.00–0.07)
Basophils Absolute: 0 10*3/uL (ref 0.0–0.1)
Basophils Relative: 0 %
Eosinophils Absolute: 0.1 10*3/uL (ref 0.0–0.5)
Eosinophils Relative: 1 %
HCT: 43.5 % (ref 36.0–46.0)
Hemoglobin: 14.5 g/dL (ref 12.0–15.0)
Immature Granulocytes: 0 %
Lymphocytes Relative: 31 %
Lymphs Abs: 1.8 10*3/uL (ref 0.7–4.0)
MCH: 30.9 pg (ref 26.0–34.0)
MCHC: 33.3 g/dL (ref 30.0–36.0)
MCV: 92.6 fL (ref 80.0–100.0)
Monocytes Absolute: 0.5 10*3/uL (ref 0.1–1.0)
Monocytes Relative: 9 %
Neutro Abs: 3.3 10*3/uL (ref 1.7–7.7)
Neutrophils Relative %: 59 %
Platelets: 254 10*3/uL (ref 150–400)
RBC: 4.7 MIL/uL (ref 3.87–5.11)
RDW: 11.9 % (ref 11.5–15.5)
WBC: 5.7 10*3/uL (ref 4.0–10.5)
nRBC: 0 % (ref 0.0–0.2)

## 2020-02-14 LAB — I-STAT BETA HCG BLOOD, ED (MC, WL, AP ONLY): I-stat hCG, quantitative: <5 m[IU]/mL

## 2020-02-14 LAB — TSH: TSH: 1.897 u[IU]/mL (ref 0.350–4.500)

## 2020-02-14 LAB — D-DIMER, QUANTITATIVE: D-Dimer, Quant: <0.27 ug/mL-FEU (ref 0.00–0.50)

## 2020-02-14 LAB — T4, FREE: Free T4: 0.99 ng/dL (ref 0.61–1.12)

## 2020-02-14 MED ORDER — CITALOPRAM HYDROBROMIDE 20 MG PO TABS: 20.0000 mg | ORAL_TABLET | Freq: Every day | ORAL | 2 refills | Status: AC

## 2020-02-14 NOTE — ED Provider Notes (Signed)
[facility] EMERGENCY DEPARTMENT Provider Note   CSN: [id number] Arrival date & time: 02/13/20  2024     History Chief Complaint  Patient presents with  . Thyroid Nodule    Beverly Walter is a 24 y.o. female.  HPI     This is a 24 year old female who presents with chest pain, palpitations, continued weight loss.  Patient reports that she started having symptoms of weight loss and decreased appetite in July.  She was seen and evaluated in the emergency department September and found to have a thyroid nodule.  She has attempted as an outpatient to obtain a thyroid ultrasound but has been unsuccessful.  She does not have a primary physician.  However, she states since Thursday she has had recurrent sharp chest pains and palpitations.  She states that sometimes the pain is worse with breathing.  It comes and goes.  She reports associated palpitations.  She also reports continued weight loss during this time.  No recent travel or leg swelling.  She currently rates her pain at 5 out of 10.  She is not taking anything for the pain.  She states she is concerned because her grandmother had a heart attack "at my age."  Denies any recent illnesses or fevers.  Past Medical History:  Diagnosis Date  . Medical history non-contributory     Patient Active Problem List   Diagnosis Date Noted  . IUD (intrauterine device) in place 12/06/2019  . Iron deficiency anemia 11/08/2015    Past Surgical History:  Procedure Laterality Date  . CESAREAN SECTION    . DILATION AND CURETTAGE OF UTERUS       OB History    Gravida  3   Para  2   Term  1   Preterm  1   AB  1   Living  2     SAB  1   TAB  0   Ectopic  0   Multiple  0   Live Births  1           History reviewed. No pertinent family history.  Social History   Tobacco Use  . Smoking status: Never Smoker  . Smokeless tobacco: Never Used  Substance Use Topics  . Alcohol use: No  . Drug use: No     Home Medications Prior to Admission medications   Medication Sig Start Date End Date Taking? Authorizing Provider  citalopram (CELEXA) 20 MG tablet Take 1 tablet (20 mg total) by mouth daily. 02/14/20   Arnel Wymer, Barbette Hair, MD  ferrous sulfate 325 (65 FE) MG tablet Take 1 tablet (325 mg total) by mouth daily. Patient not taking: Reported on 01/20/2016 11/08/15   Luvenia Redden, PA-C  ibuprofen (ADVIL,MOTRIN) 600 MG tablet Take 1 tablet (600 mg total) by mouth every 6 (six) hours. 12/09/15   Keitha Butte, CNM  Prenatal Multivit-Min-Fe-FA (PRENATAL VITAMINS) 0.8 MG tablet Take 1 tablet by mouth daily. Patient not taking: Reported on 01/20/2016 10/14/15   Woodroe Mode, MD    Allergies    Patient has no known allergies.  Review of Systems   Review of Systems  Constitutional: Positive for unexpected weight change. Negative for fever.  Respiratory: Negative for cough and shortness of breath.   Cardiovascular: Positive for chest pain and palpitations. Negative for leg swelling.  Gastrointestinal: Negative for abdominal pain, diarrhea, nausea and vomiting.  Genitourinary: Negative for dysuria.  All other systems reviewed and are negative.   Physical  Exam Updated Vital Signs BP 111/77 (BP Location: Right Arm)   Pulse 88   Temp 98.3 F (36.8 C) (Oral)   Resp 16   SpO2 96%   Physical Exam Vitals and nursing note reviewed.  Constitutional:      Appearance: She is well-developed. She is not ill-appearing.  HENT:     Head: Normocephalic and atraumatic.     Nose: Nose normal.     Mouth/Throat:     Mouth: Mucous membranes are moist.  Eyes:     Pupils: Pupils are equal, round, and reactive to light.  Neck:     Comments: Thyroid nodule palpated Cardiovascular:     Rate and Rhythm: Normal rate and regular rhythm.     Heart sounds: Normal heart sounds.  Pulmonary:     Effort: Pulmonary effort is normal. No respiratory distress.     Breath sounds: No wheezing.  Abdominal:      General: Bowel sounds are normal.     Palpations: Abdomen is soft.     Tenderness: There is no abdominal tenderness.  Musculoskeletal:        General: No tenderness.     Cervical back: Neck supple.     Right lower leg: No edema.     Left lower leg: No edema.  Skin:    General: Skin is warm and dry.  Neurological:     Mental Status: She is alert and oriented to person, place, and time.  Psychiatric:        Mood and Affect: Mood normal.     ED Results / Procedures / Treatments   Labs (all labs ordered are listed, but only abnormal results are displayed) Labs Reviewed  CBC WITH DIFFERENTIAL/PLATELET  BASIC METABOLIC PANEL  D-DIMER, QUANTITATIVE (NOT AT [facility])  TSH  T4, FREE  I-STAT BETA HCG BLOOD, ED ([facility], [facility], [facility] ONLY)  TROPONIN I (HIGH SENSITIVITY)    EKG EKG Interpretation  Date/Time:  Tuesday February 13 2020 20:41:22 EST Ventricular Rate:  74 PR Interval:  154 QRS Duration: 78 QT Interval:  384 QTC Calculation: 426 R Axis:   92 Text Interpretation: Normal sinus rhythm Rightward axis Borderline ECG Confirmed by Thayer Jew [phone]) on 02/14/2020 2:00:28 AM   Radiology DG Chest 2 View  Result Date: 02/14/2020 CLINICAL DATA:  Palpitations EXAM: CHEST - 2 VIEW COMPARISON:  Radiograph 02/12/2020 FINDINGS: No consolidation, features of edema, pneumothorax, or effusion. Pulmonary vascularity is normally distributed. The cardiomediastinal contours are unremarkable. No acute osseous or soft tissue abnormality. IMPRESSION: No acute cardiopulmonary abnormality. Electronically Signed   By: Lovena Le M.D.   On: 02/14/2020 00:59   DG Chest 2 View  Result Date: 02/12/2020 CLINICAL DATA:  Chest pain EXAM: CHEST - 2 VIEW COMPARISON:  None. FINDINGS: Lungs are clear. Heart size and pulmonary vascularity are normal. No adenopathy. No pneumothorax. No bone lesions. IMPRESSION: No abnormality noted. Electronically Signed   By: Lowella Grip III M.D.   On: 02/12/2020 16:21     Procedures Procedures (including critical care time)  Medications Ordered in ED Medications - No data to display  ED Course  I have reviewed the triage vital signs and the nursing notes.  Pertinent labs & imaging results that were available during my care of the patient were reviewed by me and considered in my medical decision making (see chart for details).    MDM Rules/Calculators/A&P  Patient presents with concerns for palpitations and chest pain.  Also unable to obtain outpatient thyroid ultrasound.  She is nontoxic-appearing.  Vital signs are reassuring.  She is not significantly tachycardic and I do not believe she is in thyroid storm.  EKG is without acute ischemic or arrhythmic changes.  Chest x-ray without pneumothorax or pneumonia.  D-dimer was sent to screen for PE and this is negative.  Troponin is negative.  Free T4 and TSH have been sent and are pending.  Patient is having a difficult time obtaining outpatient ultrasound.  Have consulted case management to help obtain this for the patient.  Appreciate their help.  At this time do not feel patient has an acute emergent process.  After history, exam, and medical workup I feel the patient has been appropriately medically screened and is safe for discharge home. Pertinent diagnoses were discussed with the patient. Patient was given return precautions.    Final Clinical Impression(s) / ED Diagnoses Final diagnoses:  Thyroid nodule  Palpitations    Rx / DC Orders ED Discharge Orders         Ordered    citalopram (CELEXA) 20 MG tablet  Daily        02/14/20 0451           Merryl Hacker, MD 02/14/20 [phone]

## 2020-02-14 NOTE — Discharge Instructions (Signed)
You were seen today for palpitations and chest pain.  Your work-up is reassuring.  You do have a thyroid nodule that needs have outpatient ultrasound.  Anticipate a call from social work.

## 2020-02-14 NOTE — Progress Notes (Signed)
RNCM noted CM consult.  Reviewed chart and basis for consult are unclear as the [facility] consult order is incomplete and the EDP discharge note does not mention RNCM instructions.

## 2020-08-22 NOTE — Progress Notes (Signed)
Subjective:    Beverly Walter - 25 y.o. female MRN [id number]  Date of birth: [birth year]  HPI  Beverly Walter is to establish care. Patient has a PMH significant for iron deficiency anemia and intrauterine device in place.   Current issues and/or concerns: 1. HOSPITAL DISCHARGE FOLLOW-UP: Visit 12/10/2019 at [facility] Emergency Department per DO note: Beverly Walter is a 25 y.o. female with IDA that presents the emergency department today for sore throat and neck mass. No true sore throat currently. Patient states that she has had this for 5 weeks, states that a couple weeks ago she started having difficulty breathing with mass increasing in size.  Physical exam with marble sized mass on trachea, slightly lower than thyroid gland, nontender and mobile.  Does appear lymph node-like.  No other lymph nodes noted.  No overlying signs of infection.  I think that CT is warranted at this time due to patient stating that it is expanding an she did have difficulty breathing when this happened last time.  Patient currently is not in any acute distress, is able to handle secretions and sitting comfortably in bed.  No signs of peritonsillar abscess, deep space flexion, Ludwig's.  Work-up negative.  BMP with potassium of 3.2, did replete orally today and Dr. Patient about eating high potassium rich foods.  CBC unremarkable.  Mono and strep negative.  CT does show thyroid nodule, recommend nonemergent ultrasound.  Did discuss this with patient.  Patient still not complaining of respiratory distress, sitting comfortably in bed.  Patient agreeable for follow-up with PCP.  Patient be discharged.  Doubt need for further emergent work up at this time. I explained the diagnosis and have given explicit precautions to return to the ER including for any other new or worsening symptoms. The patient understands and accepts the medical plan as it's been dictated and I have answered their questions. Discharge instructions  concerning home care and prescriptions have been given. The patient is STABLE and is discharged to home in good condition.  I discussed this case with my attending physician who cosigned this note including patient's presenting symptoms, physical exam, and planned diagnostics and interventions. Attending physician stated agreement with plan or made changes to plan which were implemented.    Visit 02/13/2020 - 02/14/2020 at [facility] Emergency Department per MD note: Patient presents with concerns for palpitations and chest pain.  Also unable to obtain outpatient thyroid ultrasound.  She is nontoxic-appearing.  Vital signs are reassuring.  She is not significantly tachycardic and I do not believe she is in thyroid storm.  EKG is without acute ischemic or arrhythmic changes.  Chest x-ray without pneumothorax or pneumonia.  D-dimer was sent to screen for PE and this is negative.  Troponin is negative.  Free T4 and TSH have been sent and are pending.  Patient is having a difficult time obtaining outpatient ultrasound.  Have consulted case management to help obtain this for the patient.  Appreciate their help.  At this time do not feel patient has an acute emergent process.  After history, exam, and medical workup I feel the patient has been appropriately medically screened and is safe for discharge home. Pertinent diagnoses were discussed with the patient. Patient was given return precautions.   08/23/2020: Reports she was told at a recent hospital visit that she has hyperthyroidism and will need to establish with a primary care provider because she will need a referral to a specialist.  Subsequently she was referred to her OB/GYN  and was told that they would not be able to seen for that concern and to follow-up with PCP.  Reports she did not establish with PCP after that appointment and began to look up hyperthyroidism on [employer]. As a result she became worried and decided to quit looking on [employer] and  to come in to establish with PCP.   Reports since the nodule was initially found it has decreased in size. Denies difficulty swallowing and shortness of breath. Endorses intermittent palpitations, fatigue, and lumps on body.  Reports she was given Celexa at most recent hospital discharge. She took medication for 3 days and then discontinued because she did not feel a difference and would rather not take medications for no particular reason.   2. BREAST PAIN: Reports feeling a painful knot at center of left breast. Radiates into left axilla. Does not change with menses.   3. RASH:  Left lower extremity mild itching rash.   Depression screen [facility] 2/9 08/23/2020 12/06/2019 01/20/2016  Decreased Interest 0 1 1  Down, Depressed, Hopeless 0 0 0  PHQ - 2 Score 0 1 1  Altered sleeping 0 0 1  Tired, decreased energy 0 1 1  Change in appetite 0 1 1  Feeling bad or failure about yourself  0 0 1  Trouble concentrating 0 0 0  Moving slowly or fidgety/restless 0 0 0  Suicidal thoughts 0 0 0  PHQ-9 Score 0 3 5  Difficult doing work/chores Not difficult at all - -    ROS per HPI   Health Maintenance:  Health Maintenance Due  Topic Date Due  . HPV VACCINES (1 - 2-dose series) Never done  . Hepatitis C Screening  Never done  . CHLAMYDIA SCREENING  10/13/2016    Past Medical History: Patient Active Problem List   Diagnosis Date Noted  . IUD (intrauterine device) in place 12/06/2019  . Iron deficiency anemia 11/08/2015    Social History   reports that she has never smoked. She has never used smokeless tobacco. She reports that she does not drink alcohol and does not use drugs.   Family History  family history is not on file.   Medications: reviewed and updated   Objective:   Physical Exam BP 120/86 (BP Location: Left Arm, Patient Position: Sitting, Cuff Size: Normal)   Pulse 79   Temp 98.4 F (36.9 C)   Resp 15   Ht 5' 8.27" (1.734 m)   Wt 118 lb 6.4 oz (53.7 kg)   SpO2 98%   BMI  17.86 kg/m  Physical Exam HENT:     Head: Normocephalic and atraumatic.  Eyes:     Extraocular Movements: Extraocular movements intact.     Conjunctiva/sclera: Conjunctivae normal.     Pupils: Pupils are equal, round, and reactive to light.  Neck:     Comments: Thyroid nodule palpated. Cardiovascular:     Rate and Rhythm: Normal rate and regular rhythm.     Pulses: Normal pulses.     Heart sounds: Normal heart sounds.  Pulmonary:     Effort: Pulmonary effort is normal.     Breath sounds: Normal breath sounds.  Chest:     Comments: Patient declined examination. Skin:    Comments: Left lower extremity, lateral aspect erythematous macular rash. No evidence of broken skin.  Neurological:     General: No focal deficit present.     Mental Status: She is alert and oriented to person, place, and time.  Psychiatric:  Mood and Affect: Mood normal.        Behavior: Behavior normal.     Assessment & Plan:  1. Encounter to establish care: - Patient presents today to establish care.  - Return for annual physical examination, labs, and health maintenance. Arrive fasting meaning having no food for at least 8 hours prior to appointment. You may have only water or black coffee. Please take scheduled medications as normal.  2. Thyroid nodule: - Ultrasound thyroid for further evaluation.  - TSH last obtained 02/14/2020 and normal at that time. - Patient prescribed Citalopram at 02/13/2020 - 02/14/2020 Emergency Department visit discharge for thyroid nodule. Patient reports only taking medication for a few days, didn't feel any relief, and quit taking since then. - Counseled that Citalopram no longer indicated considering today's depression screen PHQ 2/9 were all normal. Follow-up with primary provider as scheduled. Patient agreeable.  - US THYROID; Future  3. Breast lump: - Ultrasound left breast including axilla for further evaluation.  - US BREAST COMPLETE UNI LEFT INC AXILLA;  Future  4. Rash and nonspecific skin eruption: - Triamcinolone cream as prescribed. - Follow-up with primary provider as scheduled.  - triamcinolone cream (KENALOG) 0.1 %; Apply 1 application topically 2 (two) times daily.  Dispense: 80 g; Refill: 0    Patient was given clear instructions to go to Emergency Department or return to medical center if symptoms don't improve, worsen, or new problems develop.The patient verbalized understanding.  I discussed the assessment and treatment plan with the patient. The patient was provided an opportunity to ask questions and all were answered. The patient agreed with the plan and demonstrated an understanding of the instructions.   The patient was advised to call back or seek an in-person evaluation if the symptoms worsen or if the condition fails to improve as anticipated.    Durene Fruits, NP 08/27/2020, 10:00 AM Primary Care at [facility]

## 2020-08-23 ENCOUNTER — Other Ambulatory Visit: Payer: Self-pay

## 2020-08-23 ENCOUNTER — Encounter: Payer: Self-pay | Admitting: Family

## 2020-08-23 ENCOUNTER — Ambulatory Visit (INDEPENDENT_AMBULATORY_CARE_PROVIDER_SITE_OTHER): Payer: 59 | Admitting: Family

## 2020-08-23 ENCOUNTER — Telehealth: Payer: Medicaid Other | Admitting: Nurse Practitioner

## 2020-08-23 VITALS — BP 120/86 | HR 79 | Temp 98.4°F | Resp 15 | Ht 68.27 in | Wt 118.4 lb

## 2020-08-23 DIAGNOSIS — N63 Unspecified lump in unspecified breast: Secondary | ICD-10-CM

## 2020-08-23 DIAGNOSIS — E041 Nontoxic single thyroid nodule: Secondary | ICD-10-CM | POA: Diagnosis not present

## 2020-08-23 DIAGNOSIS — R21 Rash and other nonspecific skin eruption: Secondary | ICD-10-CM | POA: Diagnosis not present

## 2020-08-23 DIAGNOSIS — Z7689 Persons encountering health services in other specified circumstances: Secondary | ICD-10-CM | POA: Diagnosis not present

## 2020-08-23 MED ORDER — TRIAMCINOLONE ACETONIDE 0.1 % EX CREA: 1.0000 "application " | TOPICAL_CREAM | Freq: Two times a day (BID) | CUTANEOUS | 0 refills | Status: AC

## 2020-08-23 NOTE — Patient Instructions (Signed)
- Return for annual physical examination, labs, and health maintenance. Arrive fasting meaning having no food for at least 8 hours prior to appointment. You may have only water or black coffee. Please take scheduled medications as normal. Thank you for choosing Primary Care at [facility] for your medical home!    Beverly Walter was seen by Camillia Herter, NP today.   Beverly Walter's primary care provider is Camillia Herter, NP.   For the best care possible,  you should try to see Durene Fruits, NP whenever you come to clinic.   We look forward to seeing you again soon!  If you have any questions about your visit today,  please call us at [phone]  Or feel free to reach your provider via [place].    Keeping you healthy   Get these tests  Blood pressure- Have your blood pressure checked once a year by your healthcare provider.  Normal blood pressure is 120/80.  Weight- Have your body mass index (BMI) calculated to screen for obesity.  BMI is a measure of body fat based on height and weight. You can also calculate your own BMI at [employer].it.  Cholesterol- Have your cholesterol checked regularly starting at age 63, sooner may be necessary if you have diabetes, high blood pressure, if a family member developed heart diseases at an early age or if you smoke.   Chlamydia, HIV, and other sexual transmitted disease- Get screened each year until the age of 31 then within three months of each new sexual partner.  Diabetes- Have your blood sugar checked regularly if you have high blood pressure, high cholesterol, a family history of diabetes or if you are overweight.   Get these vaccines  Flu shot- Every fall.  Tetanus shot- Every 10 years.  Menactra- Single dose; prevents meningitis.   Take these steps  Don't smoke- If you do smoke, ask your healthcare provider about quitting. For tips on how to quit, go to www.smokefree.gov or call 1-800-QUIT-NOW.  Be physically  active- Exercise 5 days a week for at least 30 minutes.  If you are not already physically active start slow and gradually work up to 30 minutes of moderate physical activity.  Examples of moderate activity include walking briskly, mowing the yard, dancing, swimming bicycling, etc.  Eat a healthy diet- Eat a variety of healthy foods such as fruits, vegetables, low fat milk, low fat cheese, yogurt, lean meats, poultry, fish, beans, tofu, etc.  For more information on healthy eating, go to www.thenutritionsource.org  Drink alcohol in moderation- Limit alcohol intake two drinks or less a day.  Never drink and drive.  Dentist- Brush and floss teeth twice daily; visit your dentis twice a year.  Depression-Your emotional health is as important as your physical health.  If you're feeling down, losing interest in things you normally enjoy please talk with your healthcare provider.  Gun Safety- If you keep a gun in your home, keep it unloaded and with the safety lock on.  Bullets should be stored separately.  Helmet use- Always wear a helmet when riding a motorcycle, bicycle, rollerblading or skateboarding.  Safe sex- If you may be exposed to a sexually transmitted infection, use a condom  Seat belts- Seat bels can save your life; always wear one.  Smoke/Carbon Monoxide detectors- These detectors need to be installed on the appropriate level of your home.  Replace batteries at least once a year.  Skin Cancer- When out in the sun, cover up and use  sunscreen SPF 15 or higher.  Violence- If anyone is threatening or hurting you, please tell your healthcare provider.

## 2020-08-23 NOTE — Progress Notes (Signed)
Establish care Pt went to ED for mass in throat in 02/2020 diagnosed w/hyperthyroidism  Pt symptoms losing weight,fatigue, lumps are all over body and genital area, left axilla and left breast pain

## 2020-09-10 ENCOUNTER — Ambulatory Visit: Payer: 59 | Admitting: Obstetrics and Gynecology

## 2020-09-23 NOTE — Progress Notes (Signed)
Patient did not show for appointment.   

## 2020-09-24 ENCOUNTER — Encounter: Payer: 59 | Admitting: Family

## 2021-01-28 ENCOUNTER — Ambulatory Visit: Payer: 59 | Admitting: Family Medicine

## 2021-02-22 ENCOUNTER — Encounter (HOSPITAL_COMMUNITY): Payer: Self-pay

## 2021-02-22 ENCOUNTER — Emergency Department (HOSPITAL_COMMUNITY)
Admission: EM | Admit: 2021-02-22 | Discharge: 2021-02-22 | Discharge status: Against Medical Advice | Payer: Medicaid Other | Attending: Emergency Medicine | Admitting: Emergency Medicine

## 2021-02-22 ENCOUNTER — Other Ambulatory Visit: Payer: Self-pay

## 2021-02-22 DIAGNOSIS — R197 Diarrhea, unspecified: Secondary | ICD-10-CM | POA: Insufficient documentation

## 2021-02-22 DIAGNOSIS — Z20822 Contact with and (suspected) exposure to covid-19: Secondary | ICD-10-CM | POA: Insufficient documentation

## 2021-02-22 DIAGNOSIS — R1110 Vomiting, unspecified: Secondary | ICD-10-CM | POA: Insufficient documentation

## 2021-02-22 DIAGNOSIS — J101 Influenza due to other identified influenza virus with other respiratory manifestations: Secondary | ICD-10-CM | POA: Insufficient documentation

## 2021-02-22 DIAGNOSIS — Z5321 Procedure and treatment not carried out due to patient leaving prior to being seen by health care provider: Secondary | ICD-10-CM | POA: Insufficient documentation

## 2021-02-22 LAB — COMPREHENSIVE METABOLIC PANEL
ALT: 20 U/L (ref 0–44)
AST: 28 U/L (ref 15–41)
Albumin: 4.2 g/dL (ref 3.5–5.0)
Alkaline Phosphatase: 60 U/L (ref 38–126)
Anion gap: 13 (ref 5–15)
BUN: 5 mg/dL — ABNORMAL LOW (ref 6–20)
CO2: 24 mmol/L (ref 22–32)
Calcium: 9.3 mg/dL (ref 8.9–10.3)
Chloride: 97 mmol/L — ABNORMAL LOW (ref 98–111)
Creatinine, Ser: 0.74 mg/dL (ref 0.44–1.00)
GFR, Estimated: >60 mL/min (ref >60)
Glucose, Bld: 104 mg/dL — ABNORMAL HIGH (ref 70–99)
Potassium: 3.6 mmol/L (ref 3.5–5.1)
Sodium: 134 mmol/L — ABNORMAL LOW (ref 135–145)
Total Bilirubin: 1 mg/dL (ref 0.3–1.2)
Total Protein: 7.7 g/dL (ref 6.5–8.1)

## 2021-02-22 LAB — URINALYSIS, ROUTINE W REFLEX MICROSCOPIC
Bacteria, UA: NONE SEEN
Bilirubin Urine: NEGATIVE
Glucose, UA: NEGATIVE mg/dL
Hgb urine dipstick: NEGATIVE
Ketones, ur: 80 mg/dL — AB
Leukocytes,Ua: NEGATIVE
Nitrite: NEGATIVE
Protein, ur: 100 mg/dL — AB
Specific Gravity, Urine: 1.023 (ref 1.005–1.030)
pH: 6 (ref 5.0–8.0)

## 2021-02-22 LAB — RESP PANEL BY RT-PCR (FLU A&B, COVID) ARPGX2
Influenza A by PCR: POSITIVE — AB
Influenza B by PCR: NEGATIVE
SARS Coronavirus 2 by RT PCR: NEGATIVE

## 2021-02-22 LAB — I-STAT BETA HCG BLOOD, ED (MC, WL, AP ONLY): I-stat hCG, quantitative: <5 m[IU]/mL (ref <5)

## 2021-02-22 LAB — CBC
HCT: 48.9 % — ABNORMAL HIGH (ref 36.0–46.0)
Hemoglobin: 16.6 g/dL — ABNORMAL HIGH (ref 12.0–15.0)
MCH: 29.5 pg (ref 26.0–34.0)
MCHC: 33.9 g/dL (ref 30.0–36.0)
MCV: 86.9 fL (ref 80.0–100.0)
Platelets: 161 10*3/uL (ref 150–400)
RBC: 5.63 MIL/uL — ABNORMAL HIGH (ref 3.87–5.11)
RDW: 12.1 % (ref 11.5–15.5)
WBC: 10 10*3/uL (ref 4.0–10.5)
nRBC: 0 % (ref 0.0–0.2)

## 2021-02-22 LAB — LIPASE, BLOOD: Lipase: 27 U/L (ref 11–51)

## 2021-02-22 NOTE — ED Notes (Signed)
Pt called for vitals recheck x3. No response.

## 2021-02-22 NOTE — ED Triage Notes (Signed)
Patient complains of 5 days of vomiting and diarrhea. States that her child has same. Patient reports that if she eats she vomiting. Alert and oriented, denies pain

## 2022-09-15 IMAGING — DX DG CHEST 2V
2 series · 2 of 2 positions shown · non-contrast
Comparison: Radiograph 02/12/2020

CLINICAL DATA: Palpitations

EXAM:
CHEST - 2 VIEW

[chest pa]
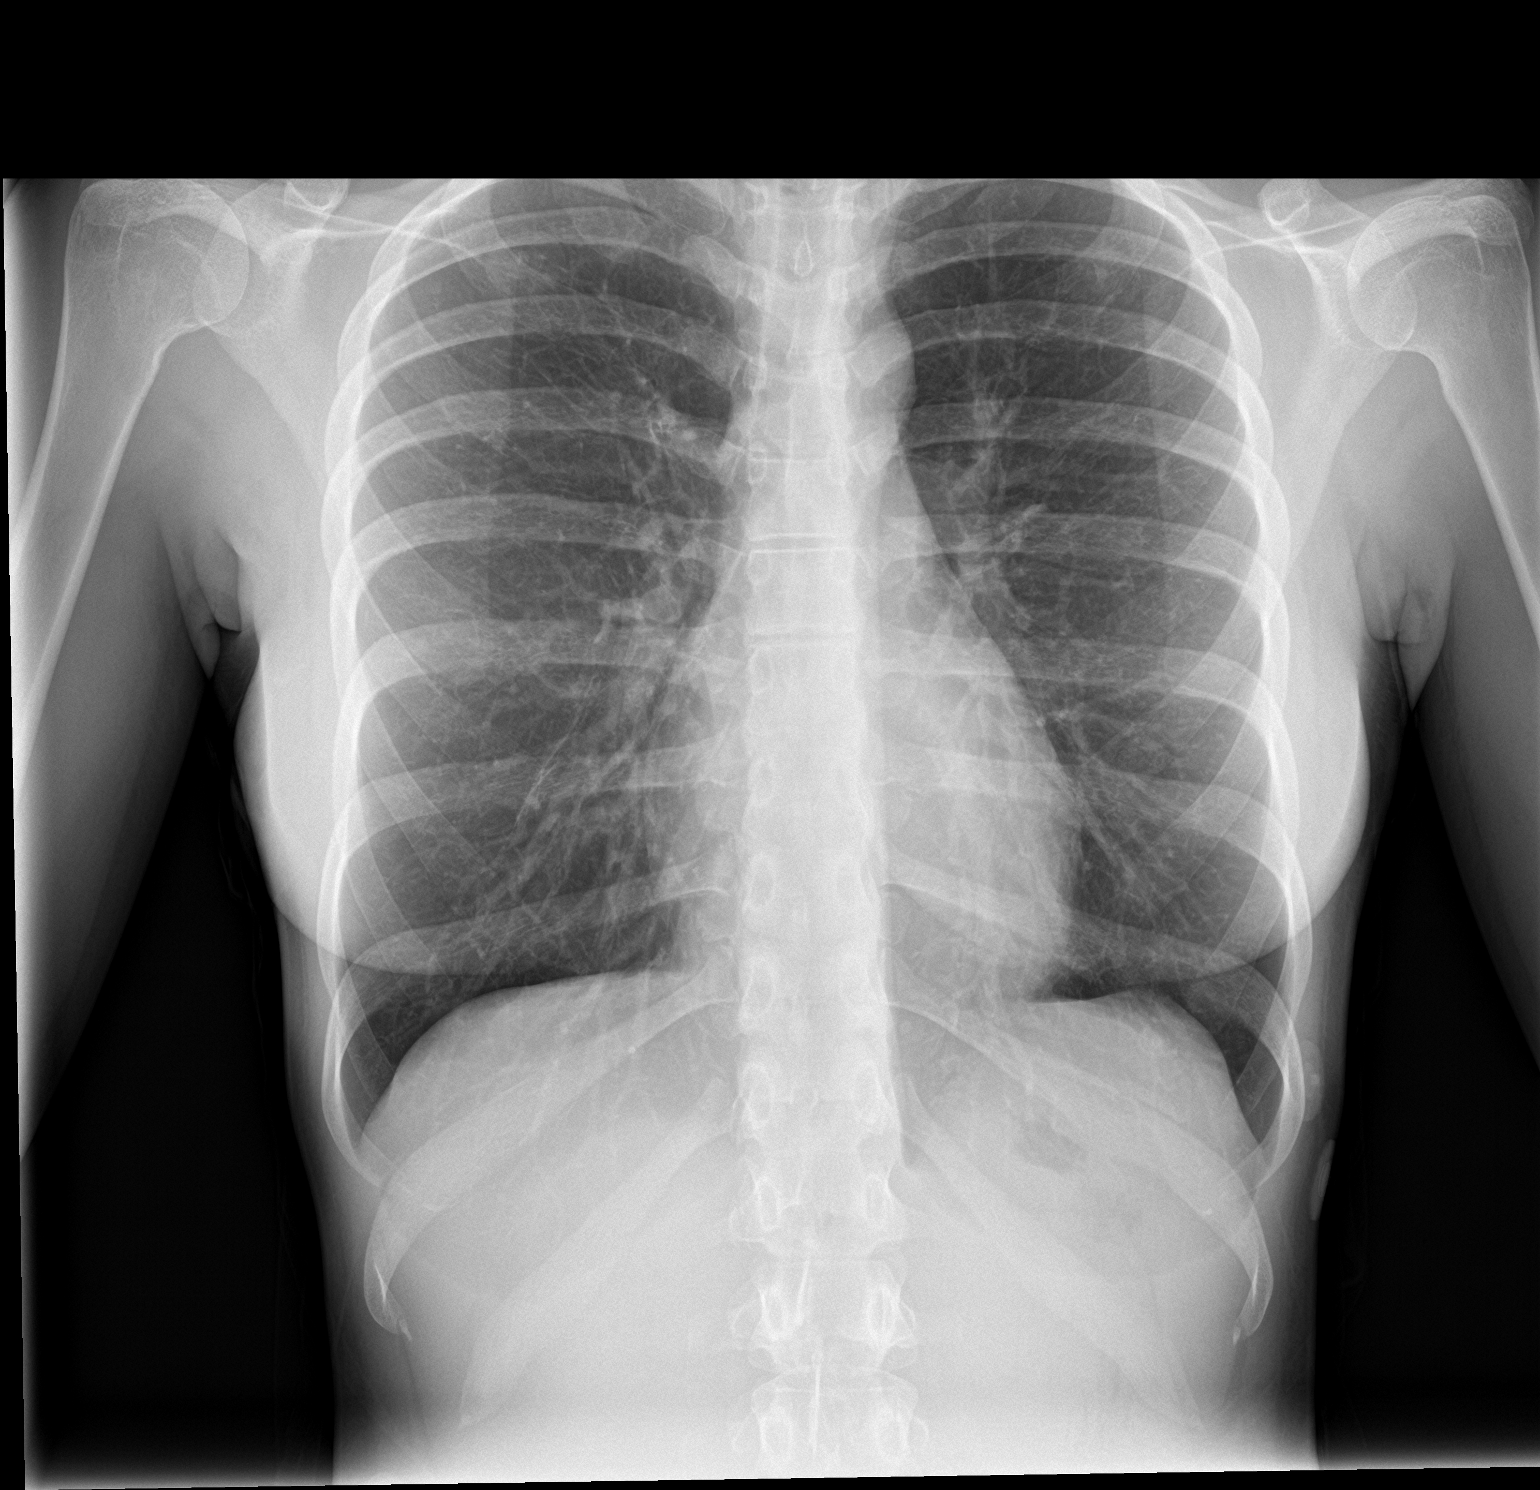

[chest lat]
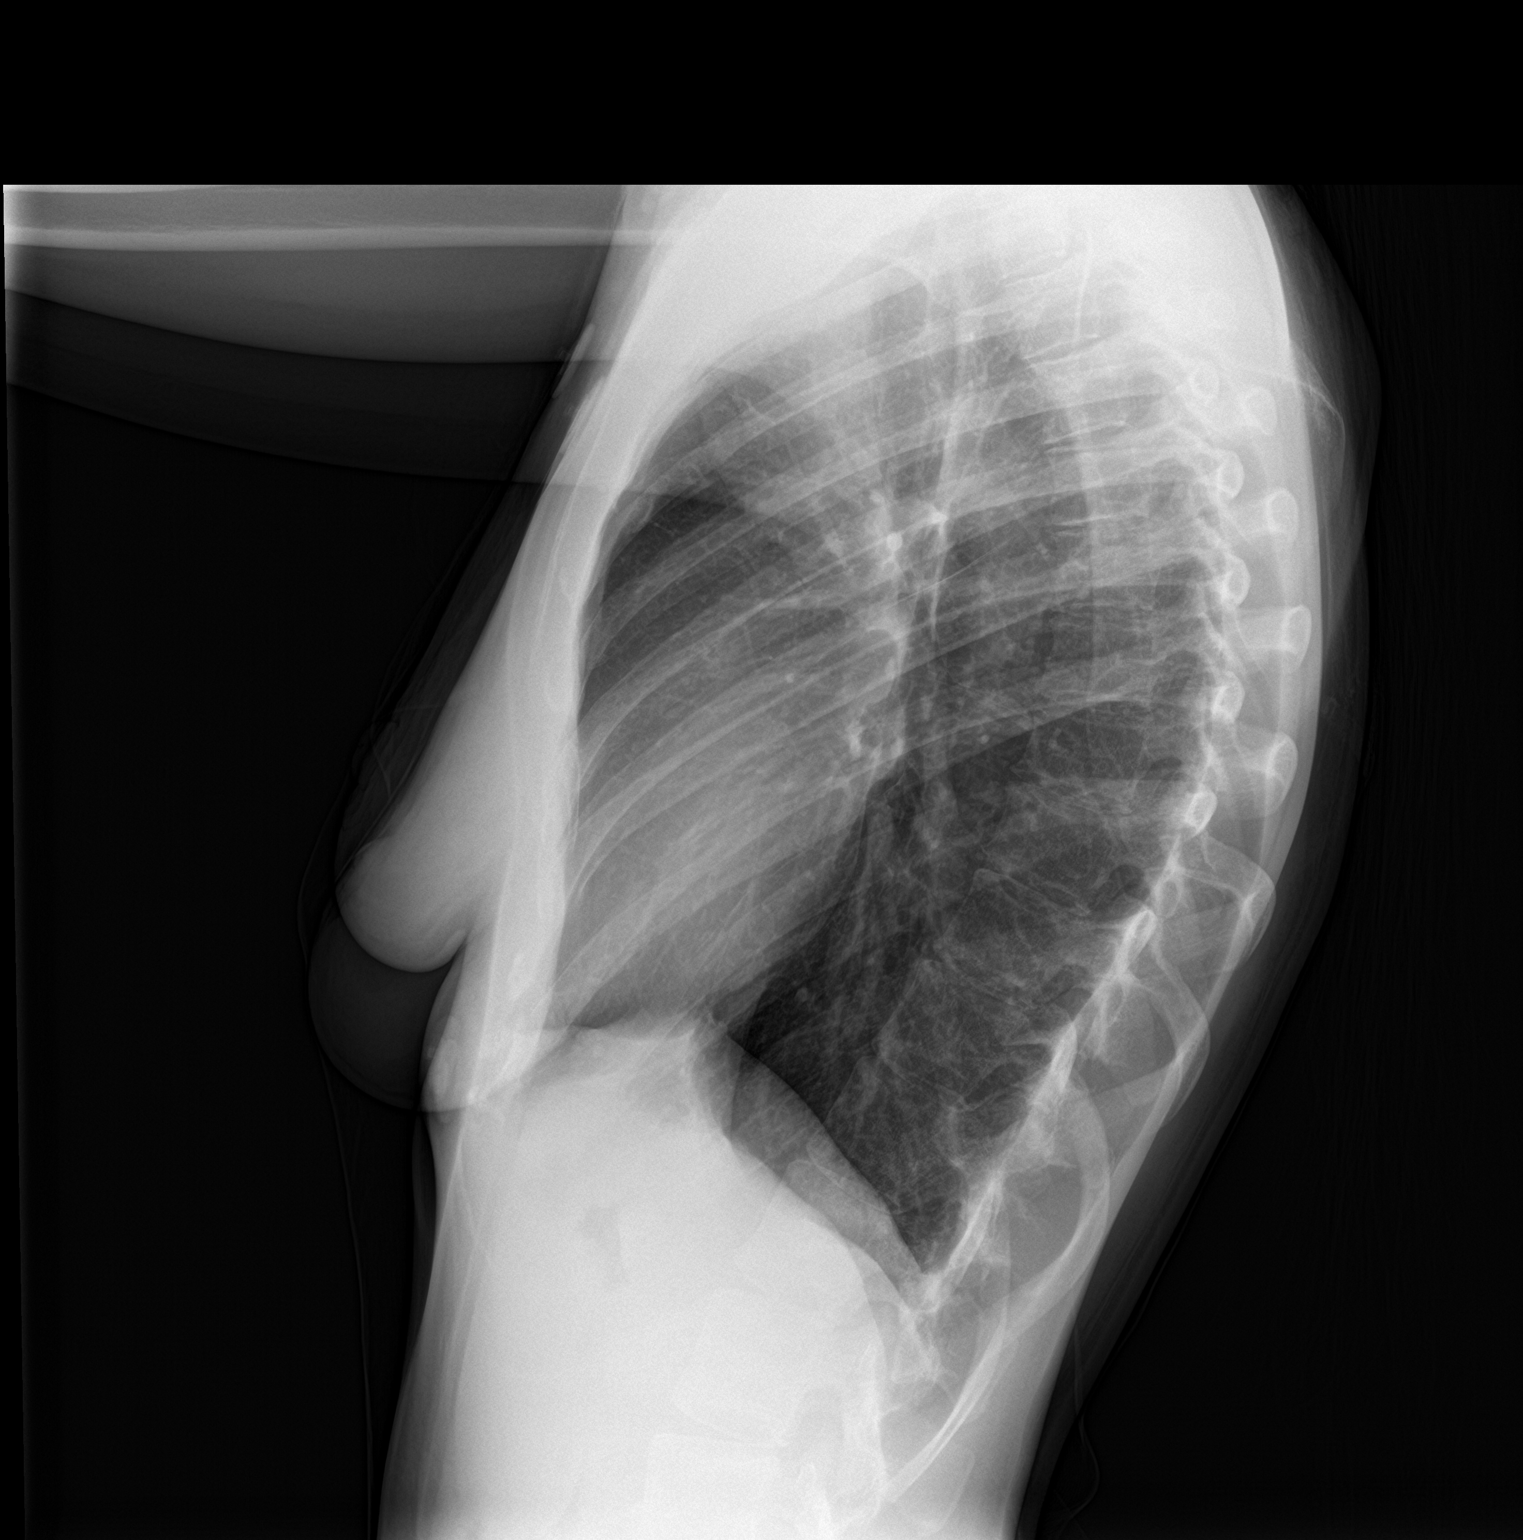

[2 of 2 positions shown; findings below may reference images not displayed]

FINDINGS: No consolidation, features of edema, pneumothorax, or effusion.
Pulmonary vascularity is normally distributed. The cardiomediastinal
contours are unremarkable. No acute osseous or soft tissue
abnormality.
IMPRESSION: No acute cardiopulmonary abnormality.
